# Patient Record
Sex: Male | Born: 2000 | Race: Black or African American | Hispanic: No | Marital: Single
Health system: Southern US, Community
[De-identification: ages and names within clinical notes are randomized; demographics above are authoritative.]

## PROBLEM LIST (undated history)

## (undated) DIAGNOSIS — T7840XA Allergy, unspecified, initial encounter: Secondary | ICD-10-CM

## (undated) HISTORY — DX: Allergy, unspecified, initial encounter: T78.40XA

---
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
## 2021-10-24 ENCOUNTER — Ambulatory Visit (INDEPENDENT_AMBULATORY_CARE_PROVIDER_SITE_OTHER): Payer: Managed Care, Other (non HMO) | Admitting: Family Medicine

## 2021-10-24 VITALS — BP 118/78 | HR 72 | Temp 98.1°F | Ht 69.0 in | Wt 132.8 lb

## 2021-10-24 DIAGNOSIS — Z91018 Allergy to other foods: Secondary | ICD-10-CM

## 2021-10-24 DIAGNOSIS — R011 Cardiac murmur, unspecified: Secondary | ICD-10-CM

## 2021-10-24 DIAGNOSIS — L720 Epidermal cyst: Secondary | ICD-10-CM

## 2021-10-24 DIAGNOSIS — Z7689 Persons encountering health services in other specified circumstances: Secondary | ICD-10-CM

## 2021-10-24 DIAGNOSIS — K5900 Constipation, unspecified: Secondary | ICD-10-CM

## 2021-10-24 MED ORDER — EPINEPHRINE 0.3 MG/0.3ML IJ SOAJ: 0.3000 mg | INTRAMUSCULAR | 1 refills | Status: DC | PRN

## 2021-10-24 NOTE — Progress Notes (Signed)
Patient presents to clinic today to f/u on ongoing concerns and establish care. Pt is accompanied by his mother.  SUBJECTIVE: PMH: Pt is a 21 yo male with pmh sig for food allergies.  Pt previously seen by pediatrician.  Mom denies h/o issues during pregnancy or birth.  Does state pt had jaundice at birth requiring biliblanket/lights.  Bump on head: -Started around puberty -increasing in size, more noticeable -not painful -nothing ever done for it  Allergies to foods: -pt develops a rash if  skin comes in contact with milk or eggs. -Endorses emesis if drinks milk. -avoids dairy. -inquires about allergy testing -has never had or needed epi pen  Constipation: -ongoing h/o   Past surgical history: None  Social history: Patient is single.  He is a Engineer, agricultural.  Attending [employer].  Looking for a job.  Patient denies alcohol, tobacco, drug use.  Health Maintenance: Dental --Bing Matter, [facility] Vision --Patty vision Immunizations --  Family medical history: Mom-Alive, DM 1 Dad-alive, renal disease Brother-Andrew, alive Brother-Greg, deceased, early death MGM-deceased at age 15, ovarian cancer MGF-deceased, DM 2, MI x2, schizophrenia PGM-alive  No past medical history on file.    No current outpatient medications on file prior to visit.   No current facility-administered medications on file prior to visit.    Not on File  No family history on file.  Social History   Socioeconomic History   Marital status: Single    Spouse name: Not on file   Number of children: Not on file   Years of education: Not on file   Highest education level: Not on file  Occupational History   Not on file  Tobacco Use   Smoking status: Not on file   Smokeless tobacco: Not on file  Substance and Sexual Activity   Alcohol use: Not on file   Drug use: Not on file   Sexual activity: Not on file  Other Topics Concern   Not on file  Social History Narrative    Not on file   Social Determinants of Health   Financial Resource Strain: Not on file  Food Insecurity: Not on file  Transportation Needs: Not on file  Physical Activity: Not on file  Stress: Not on file  Social Connections: Not on file  Intimate Partner Violence: Not on file    ROS General: Denies fever, chills, night sweats, changes in weight, changes in appetite HEENT: Denies headaches, ear pain, changes in vision, rhinorrhea, sore throat CV: Denies CP, palpitations, SOB, orthopnea Pulm: Denies SOB, cough, wheezing GI: Denies abdominal pain, nausea, vomiting, diarrhea, constipation GU: Denies dysuria, hematuria, frequency, vaginal discharge Msk: Denies muscle cramps, joint pains Neuro: Denies weakness, numbness, tingling Skin: Denies rashes, bruising  +bump of scalp Psych: Denies depression, anxiety, hallucinations   BP 118/78 (BP Location: Right Arm, Patient Position: Sitting, Cuff Size: Normal)   Pulse 72   Temp 98.1 F (36.7 C) (Oral)   Ht 5\' 9"  (1.753 m)   Wt 132 lb 12.8 oz (60.2 kg)   SpO2 96%   BMI 19.61 kg/m   Physical Exam Gen. Pleasant, well developed, well-nourished, in NAD HEENT - [place]/AT, PERRL, EOMI, conjunctive clear, no scleral icterus, no nasal drainage, TMs normal b/l. Lungs: no use of accessory muscles, CTAB, no wheezes, rales or rhonchi Cardiovascular: RRR, No r/g/m, click heard in Tricuspid area,  no peripheral edema Abdomen: BS present, soft, nontender,nondistended Musculoskeletal: No deformities, moves all four extremities, no cyanosis or clubbing, normal tone  Neuro:  A&Ox3, CN II-XII intact, normal gait Skin:  Warm, dry, intact.  L posterior parietal area with mobile slightly fluctuant mass underneath scalp. No TTP of scalp.  No skin changes.  No results found for this or any previous visit (from the past 2160 hour(s)).  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 -bump on head feels  consistent with cyst, likely epidermoid. -given location discussed referral to derm vs plastics for removal.  - Plan: Ambulatory referral to Dermatology  Systolic click  -new problem.  Never mentioned in past. -click heard on exam in tricuspid area - Plan: ECHOCARDIOGRAM COMPLETE  Multiple food allergies -continue avoiding foods known to cause problems. -Consider allergy testing.  If desired will place referral to allergist. -Given Rx for EpiPen  - Plan: EPINEPHrine 0.3 mg/0.3 mL IJ SOAJ injection  Constipation, unspecified constipation type -Discussed decreasing intake of fried, greasy, and fast foods. -Discussed increasing p.o. intake of water and vegetables -OTC MiraLAX as needed -For continued worsening symptoms will place referral to GI  Encounter to establish care -We reviewed the PMH, PSH, FH, SH, Meds and Allergies. -We provided refills for any medications we will prescribe as needed. -We addressed current concerns per orders and patient instructions. -We have asked for records for pertinent exams, studies, vaccines and notes from previous providers. -We have advised patient to follow up per instructions below.  F/u prn for CPE at patien'st convenience.  Abbe Amsterdam, MD

## 2021-10-24 NOTE — Patient Instructions (Signed)
Referrals were placed for you to see the Dermatologist for the cyst like area on your scalp and one for the ECHO of your heart.  You should expect phone calls about scheduling these appointments.  If you have not heard anything in the next 2 weeks notify the office so that we can check into it.  It was a pleasure meeting you guys this visit take care.

## 2021-10-28 ENCOUNTER — Telehealth: Payer: Self-pay

## 2021-10-28 NOTE — Telephone Encounter (Signed)
PA started for EPINEPHrine 0.3 mg pen. Key [id number]

## 2021-10-29 ENCOUNTER — Encounter: Payer: Self-pay | Admitting: Family Medicine

## 2021-11-06 ENCOUNTER — Ambulatory Visit (HOSPITAL_COMMUNITY): Payer: Managed Care, Other (non HMO) | Attending: Family Medicine

## 2021-11-06 DIAGNOSIS — I5030 Unspecified diastolic (congestive) heart failure: Secondary | ICD-10-CM | POA: Diagnosis not present

## 2021-11-06 DIAGNOSIS — R011 Cardiac murmur, unspecified: Secondary | ICD-10-CM | POA: Diagnosis not present

## 2021-11-06 DIAGNOSIS — I5033 Acute on chronic diastolic (congestive) heart failure: Secondary | ICD-10-CM

## 2021-11-06 LAB — ECHOCARDIOGRAM COMPLETE
Area-P 1/2: 4.23 cm2
S' Lateral: 2.8 cm

## 2021-11-07 ENCOUNTER — Ambulatory Visit (INDEPENDENT_AMBULATORY_CARE_PROVIDER_SITE_OTHER): Payer: Managed Care, Other (non HMO) | Admitting: Family Medicine

## 2021-11-07 ENCOUNTER — Other Ambulatory Visit (INDEPENDENT_AMBULATORY_CARE_PROVIDER_SITE_OTHER): Payer: Managed Care, Other (non HMO)

## 2021-11-07 ENCOUNTER — Encounter: Payer: Managed Care, Other (non HMO) | Admitting: Family Medicine

## 2021-11-07 VITALS — BP 124/68 | HR 68 | Temp 98.2°F | Ht 69.0 in | Wt 132.0 lb

## 2021-11-07 DIAGNOSIS — Z0000 Encounter for general adult medical examination without abnormal findings: Secondary | ICD-10-CM

## 2021-11-07 DIAGNOSIS — Z91018 Allergy to other foods: Secondary | ICD-10-CM | POA: Diagnosis not present

## 2021-11-07 DIAGNOSIS — I5189 Other ill-defined heart diseases: Secondary | ICD-10-CM

## 2021-11-07 LAB — CBC WITH DIFFERENTIAL/PLATELET
Basophils Absolute: 0 10*3/uL (ref 0.0–0.1)
Basophils Relative: 0.8 % (ref 0.0–3.0)
Eosinophils Absolute: 0 10*3/uL (ref 0.0–0.7)
Eosinophils Relative: 0.8 % (ref 0.0–5.0)
HCT: 43.3 % (ref 39.0–52.0)
Hemoglobin: 14.2 g/dL (ref 13.0–17.0)
Lymphocytes Relative: 38.5 % (ref 12.0–46.0)
Lymphs Abs: 1.7 10*3/uL (ref 0.7–4.0)
MCHC: 32.7 g/dL (ref 30.0–36.0)
MCV: 88.4 fl (ref 78.0–100.0)
Monocytes Absolute: 0.4 10*3/uL (ref 0.1–1.0)
Monocytes Relative: 8.4 % (ref 3.0–12.0)
Neutro Abs: 2.2 10*3/uL (ref 1.4–7.7)
Neutrophils Relative %: 51.5 % (ref 43.0–77.0)
Platelets: 191 10*3/uL (ref 150.0–400.0)
RBC: 4.9 Mil/uL (ref 4.22–5.81)
RDW: 12.3 % (ref 11.5–15.5)
WBC: 4.3 10*3/uL (ref 4.0–10.5)

## 2021-11-07 LAB — LIPID PANEL
Cholesterol: 151 mg/dL (ref 0–200)
HDL: 45.3 mg/dL (ref >39.00)
LDL Cholesterol: 90 mg/dL (ref 0–99)
NonHDL: 105.95
Total CHOL/HDL Ratio: 3
Triglycerides: 82 mg/dL (ref 0.0–149.0)
VLDL: 16.4 mg/dL (ref 0.0–40.0)

## 2021-11-07 LAB — COMPREHENSIVE METABOLIC PANEL
ALT: 57 U/L — ABNORMAL HIGH (ref 0–53)
AST: 31 U/L (ref 0–37)
Albumin: 4.3 g/dL (ref 3.5–5.2)
Alkaline Phosphatase: 92 U/L (ref 39–117)
BUN: 17 mg/dL (ref 6–23)
CO2: 30 mEq/L (ref 19–32)
Calcium: 9.8 mg/dL (ref 8.4–10.5)
Chloride: 101 mEq/L (ref 96–112)
Creatinine, Ser: 0.94 mg/dL (ref 0.40–1.50)
GFR: 115.92 mL/min (ref >60.00)
Glucose, Bld: 81 mg/dL (ref 70–99)
Potassium: 4.3 mEq/L (ref 3.5–5.1)
Sodium: 136 mEq/L (ref 135–145)
Total Bilirubin: 0.6 mg/dL (ref 0.2–1.2)
Total Protein: 7.1 g/dL (ref 6.0–8.3)

## 2021-11-07 MED ORDER — EPINEPHRINE 0.3 MG/0.3ML IJ SOAJ: 0.3000 mg | INTRAMUSCULAR | 1 refills | Status: AC | PRN

## 2021-11-07 NOTE — Patient Instructions (Addendum)
It appears that the referral for dermatology was placed by your insurance company and sent to [facility].  If you have not heard anything from them by the end of the week consider calling their office to inquire about an appointment.  The prescription for the EpiPen was printed just in case your pharmacy tries to give you a hard time about it again. You can take it to a different pharmacy if needed.

## 2021-11-07 NOTE — Progress Notes (Signed)
Subjective:     Walter Palmer is a 21 y.o. male and is here for a comprehensive physical exam. The patient reports doing well.  Was not able to obtain EPI pen from pharmacy, was told they didn't have it.?  Pt had ECHO yesterday for click heard on exam, inquires about results.  Pt also inquires about Derm referral placed for cyst in scalp.    Social History   Socioeconomic History   Marital status: Single    Spouse name: Not on file   Number of children: Not on file   Years of education: Not on file   Highest education level: Not on file  Occupational History   Not on file  Tobacco Use   Smoking status: Not on file   Smokeless tobacco: Not on file  Substance and Sexual Activity   Alcohol use: Not on file   Drug use: Not on file   Sexual activity: Not on file  Other Topics Concern   Not on file  Social History Narrative   Not on file   Social Determinants of Health   Financial Resource Strain: Not on file  Food Insecurity: Not on file  Transportation Needs: Not on file  Physical Activity: Not on file  Stress: Not on file  Social Connections: Not on file  Intimate Partner Violence: Not on file   Health Maintenance  Topic Date Due   HPV VACCINES (1 - Male 2-dose series) Never done   HIV Screening  Never done   Hepatitis C Screening  Never done   TETANUS/TDAP  Never done   INFLUENZA VACCINE  05/18/2022 (Originally 09/17/2021)    The following portions of the patient's history were reviewed and updated as appropriate: allergies, current medications, past family history, past medical history, past social history, past surgical history, and problem list.  Review of Systems Pertinent items noted in HPI and remainder of comprehensive ROS otherwise negative.   Objective:    BP 124/68 (BP Location: Right Arm, Patient Position: Sitting, Cuff Size: Normal)   Pulse 68   Temp 98.2 F (36.8 C) (Oral)   Ht 5\' 9"  (1.753 m)   Wt 132 lb (59.9 kg)   SpO2 97%   BMI 19.49 kg/m   General appearance: alert, cooperative, and no distress Head: Normocephalic, without obvious abnormality, atraumatic Eyes: conjunctivae/corneas clear. PERRL, EOM's intact. Fundi benign. Ears: normal TM's and external ear canals both ears Nose: Nares normal. Septum midline. Mucosa normal. No drainage or sinus tenderness. Throat: lips, mucosa, and tongue normal; teeth and gums normal Neck: no adenopathy, no carotid bruit, no JVD, supple, symmetrical, trachea midline, and thyroid not enlarged, symmetric, no tenderness/mass/nodules Lungs: clear to auscultation bilaterally Heart:  RRR, click heard in tricuspid area> other areas, no murmur or rub, no peripheral edema Abdomen: soft, non-tender; bowel sounds normal; no masses,  no organomegaly Extremities: extremities normal, atraumatic, no cyanosis or edema Pulses: 2+ and symmetric Skin: Skin color, texture, turgor normal. No rashes or lesions Lymph nodes: Cervical, supraclavicular, and axillary nodes normal. Neurologic: Alert and oriented X 3, normal strength and tone. Normal symmetric reflexes. Normal coordination and gait    Assessment:    Healthy male exam.      Plan:    Anticipatory guidance given including wearing seatbelts, smoke detectors in the home, increasing physical activity, increasing p.o. intake of water and vegetables. -labs -immunizations reviewed.  Pt declines influenza vaccine at this time. -given handout -next CPE in 1 yr See After Visit Summary for Counseling  Recommendations  Well adult exam - Plan: CBC with Differential/Platelet, TSH, T4, Free, Hemoglobin A1c, Lipid panel, CMP, Vitamin D, 25-hydroxy, Vitamin B12  Multiple food allergies  - Plan: EPINEPHrine 0.3 mg/0.3 mL IJ SOAJ injection  Grade II diastolic dysfunction -Echo obtained after hearing a click in tricuspid area during OFV 10/24/2021. -ECHO 11/06/21 with LVEF 55-60%.  LV normal function.  LV has no regional wall motion abnormalities.  No LV hypertrophy.   LV diastolic parameters are consistent with grade 2 diastolic dysfunction (pseudonormalization). -Will place referral to cardiology  F/u prn  Abbe Amsterdam, MD

## 2021-11-08 LAB — TSH: TSH: 1.86 u[IU]/mL (ref 0.35–5.50)

## 2021-11-08 LAB — VITAMIN B12: Vitamin B-12: 998 pg/mL — ABNORMAL HIGH (ref 211–911)

## 2021-11-08 LAB — T4, FREE: Free T4: 0.74 ng/dL (ref 0.60–1.60)

## 2021-11-08 LAB — VITAMIN D 25 HYDROXY (VIT D DEFICIENCY, FRACTURES): VITD: 93.4 ng/mL (ref 30.00–100.00)

## 2021-11-11 LAB — HEMOGLOBIN A1C: Hgb A1c MFr Bld: 5.4 % (ref 4.6–6.5)

## 2022-10-02 ENCOUNTER — Encounter (INDEPENDENT_AMBULATORY_CARE_PROVIDER_SITE_OTHER): Payer: Self-pay

## 2022-11-10 ENCOUNTER — Encounter: Payer: Managed Care, Other (non HMO) | Admitting: Family Medicine

## 2022-11-17 ENCOUNTER — Encounter: Payer: Self-pay | Admitting: Family Medicine

## 2022-11-17 ENCOUNTER — Ambulatory Visit (INDEPENDENT_AMBULATORY_CARE_PROVIDER_SITE_OTHER): Payer: 59 | Admitting: Family Medicine

## 2022-11-17 VITALS — BP 120/80 | HR 81 | Temp 98.1°F | Ht 68.9 in | Wt 133.8 lb

## 2022-11-17 DIAGNOSIS — Z0000 Encounter for general adult medical examination without abnormal findings: Secondary | ICD-10-CM

## 2022-11-17 DIAGNOSIS — I5189 Other ill-defined heart diseases: Secondary | ICD-10-CM

## 2022-11-17 LAB — CBC WITH DIFFERENTIAL/PLATELET
Basophils Absolute: 0 10*3/uL (ref 0.0–0.1)
Basophils Relative: 1 % (ref 0.0–3.0)
Eosinophils Absolute: 0 10*3/uL (ref 0.0–0.7)
Eosinophils Relative: 0.8 % (ref 0.0–5.0)
HCT: 47.9 % (ref 39.0–52.0)
Hemoglobin: 15.1 g/dL (ref 13.0–17.0)
Lymphocytes Relative: 39.4 % (ref 12.0–46.0)
Lymphs Abs: 1.5 10*3/uL (ref 0.7–4.0)
MCHC: 31.5 g/dL (ref 30.0–36.0)
MCV: 89.8 fL (ref 78.0–100.0)
Monocytes Absolute: 0.4 10*3/uL (ref 0.1–1.0)
Monocytes Relative: 9.2 % (ref 3.0–12.0)
Neutro Abs: 1.9 10*3/uL (ref 1.4–7.7)
Neutrophils Relative %: 49.6 % (ref 43.0–77.0)
Platelets: 203 10*3/uL (ref 150.0–400.0)
RBC: 5.34 Mil/uL (ref 4.22–5.81)
RDW: 12.7 % (ref 11.5–15.5)
WBC: 3.9 10*3/uL — ABNORMAL LOW (ref 4.0–10.5)

## 2022-11-17 LAB — COMPREHENSIVE METABOLIC PANEL
ALT: 59 U/L — ABNORMAL HIGH (ref 0–53)
AST: 38 U/L — ABNORMAL HIGH (ref 0–37)
Albumin: 4.6 g/dL (ref 3.5–5.2)
Alkaline Phosphatase: 78 U/L (ref 39–117)
BUN: 15 mg/dL (ref 6–23)
CO2: 31 meq/L (ref 19–32)
Calcium: 9.9 mg/dL (ref 8.4–10.5)
Chloride: 99 meq/L (ref 96–112)
Creatinine, Ser: 0.94 mg/dL (ref 0.40–1.50)
GFR: 115.09 mL/min (ref >60.00)
Glucose, Bld: 81 mg/dL (ref 70–99)
Potassium: 4.1 meq/L (ref 3.5–5.1)
Sodium: 138 meq/L (ref 135–145)
Total Bilirubin: 0.6 mg/dL (ref 0.2–1.2)
Total Protein: 7.3 g/dL (ref 6.0–8.3)

## 2022-11-17 LAB — LIPID PANEL
Cholesterol: 178 mg/dL (ref 0–200)
HDL: 55.1 mg/dL (ref >39.00)
LDL Cholesterol: 106 mg/dL — ABNORMAL HIGH (ref 0–99)
NonHDL: 123.37
Total CHOL/HDL Ratio: 3
Triglycerides: 86 mg/dL (ref 0.0–149.0)
VLDL: 17.2 mg/dL (ref 0.0–40.0)

## 2022-11-17 LAB — T4, FREE: Free T4: 0.72 ng/dL (ref 0.60–1.60)

## 2022-11-17 LAB — TSH: TSH: 2.76 u[IU]/mL (ref 0.35–5.50)

## 2022-11-17 LAB — HEMOGLOBIN A1C: Hgb A1c MFr Bld: 5.2 % (ref 4.6–6.5)

## 2022-11-17 NOTE — Progress Notes (Signed)
Established Patient Office Visit   Subjective  Patient ID: Walter Palmer, male    DOB: [birth year]  Age: 22 y.o. MRN: [id number]  Chief Complaint  Patient presents with   Annual Exam    Patient is a 22 year old male seen for CPE.  Patient states he has been doing well overall and is without complaint.  At last OFV patient noted to have grade 2 diastolic dysfunction based on echo.  Referral to cardiology placed however patient does not recall having an appointment.  Patient mentions hair loss and scalp.  States his dad lost his hair early.  Patient was finally able to get EpiPen.    There are no problems to display for this patient.  History reviewed. No pertinent past medical history. History reviewed. No pertinent surgical history.   History reviewed. No pertinent family history. Allergies  Allergen Reactions   Milk-Related Compounds     Rash with contact.  Emesis with ingestion.   Egg-Derived Products Nausea And Vomiting and Rash    Rash   Milk (Cow) Nausea And Vomiting and Rash      ROS Negative unless stated above    Objective:     BP 120/80 (BP Location: Right Arm, Patient Position: Sitting, Cuff Size: Normal)   Pulse 81   Temp 98.1 F (36.7 C) (Oral)   Ht 5' 8.9" (1.75 m)   Wt 133 lb 12.8 oz (60.7 kg)   SpO2 95%   BMI 19.82 kg/m  BP Readings from Last 3 Encounters:  11/17/22 120/80  11/07/21 124/68  10/24/21 118/78   Wt Readings from Last 3 Encounters:  11/17/22 133 lb 12.8 oz (60.7 kg)  11/07/21 132 lb (59.9 kg)  10/24/21 132 lb 12.8 oz (60.2 kg)      Physical Exam Constitutional:      Appearance: Normal appearance.  HENT:     Head: Normocephalic and atraumatic.     Right Ear: Tympanic membrane, ear canal and external ear normal.     Left Ear: Tympanic membrane, ear canal and external ear normal.     Nose: Nose normal.     Mouth/Throat:     Mouth: Mucous membranes are moist.     Pharynx: No oropharyngeal exudate or posterior oropharyngeal  erythema.  Eyes:     General: No scleral icterus.    Extraocular Movements: Extraocular movements intact.     Conjunctiva/sclera: Conjunctivae normal.     Pupils: Pupils are equal, round, and reactive to light.  Neck:     Thyroid: No thyromegaly.  Cardiovascular:     Rate and Rhythm: Normal rate and regular rhythm.     Pulses: Normal pulses.     Heart sounds: Normal heart sounds. No murmur heard.    No friction rub.  Pulmonary:     Effort: Pulmonary effort is normal.     Breath sounds: Normal breath sounds. No wheezing, rhonchi or rales.  Abdominal:     General: Bowel sounds are normal.     Palpations: Abdomen is soft.     Tenderness: There is no abdominal tenderness.  Musculoskeletal:        General: No deformity. Normal range of motion.  Lymphadenopathy:     Cervical: No cervical adenopathy.  Skin:    General: Skin is warm and dry.     Findings: No lesion.  Neurological:     General: No focal deficit present.     Mental Status: He is alert and oriented to person, place, and time.  Psychiatric:        Mood and Affect: Mood normal.        Thought Content: Thought content normal.    No results found for any visits on 11/17/22.    Assessment & Plan:  Well adult exam -Age-appropriate health screenings discussed -Will obtain labs -Immunizations reviewed.  Patient Clines influenza vaccine this visit -Next CPE in 1 year -     CBC with Differential/Platelet -     TSH -     T4, free -     Hemoglobin A1c -     Lipid panel -     Comprehensive metabolic panel  Grade II diastolic dysfunction -Stable, euvolemic. -Echo from 920/23 with EF 55-60%, grade 2 diastolic dysfunction (pseudonormalization) -     Ambulatory referral to Cardiology -     Comprehensive metabolic panel  Return in about 1 year (around 11/17/2023) for physical.   Deeann Saint, MD

## 2022-11-18 ENCOUNTER — Encounter: Payer: Self-pay | Admitting: Family Medicine

## 2022-11-18 DIAGNOSIS — I5189 Other ill-defined heart diseases: Secondary | ICD-10-CM | POA: Insufficient documentation

## 2022-11-24 ENCOUNTER — Telehealth: Payer: Self-pay | Admitting: Family Medicine

## 2022-11-24 NOTE — Telephone Encounter (Signed)
Pt is calling and would like to know why was he referred to cardiologist

## 2022-11-26 NOTE — Telephone Encounter (Signed)
Called and spoke with patient I also sent the patient a message on my chart.

## 2022-11-26 NOTE — Telephone Encounter (Signed)
The echo had grade 2 diastolic dysfunction (pseudonormalization) with normal ejection fraction 60-65%.  Structurally the chambers of the heart are fine.  Grade 2 diastolic dysfunction refers to slight increase in pressure in the heart.  Right now it is not causing any issues but should be monitored.  A referral to cardiology was actually placed last year shortly after the echo was done, but it appears per chart review, that the appointment was declined when cardiology called to set it up.

## 2022-12-19 ENCOUNTER — Encounter (HOSPITAL_BASED_OUTPATIENT_CLINIC_OR_DEPARTMENT_OTHER): Payer: Self-pay

## 2023-11-18 ENCOUNTER — Ambulatory Visit: Payer: 59 | Admitting: Family Medicine

## 2023-11-18 VITALS — BP 130/76 | HR 94 | Temp 98.6°F | Ht 68.9 in | Wt 143.4 lb

## 2023-11-18 DIAGNOSIS — I5189 Other ill-defined heart diseases: Secondary | ICD-10-CM

## 2023-11-18 DIAGNOSIS — Z0000 Encounter for general adult medical examination without abnormal findings: Secondary | ICD-10-CM | POA: Diagnosis not present

## 2023-11-18 NOTE — Progress Notes (Signed)
 Established Patient Office Visit   Subjective  Patient ID: Walter Palmer, male    DOB: [birth year]  Age: 23 y.o. MRN: [id number]  Chief Complaint  Patient presents with   Annual Exam    Patient is a 23 year old male seen for CPE.  Patient states he is doing well overall.  Inquires about having iron checked but wonders if there is an additional cost.    Patient Active Problem List   Diagnosis Date Noted   Grade II diastolic dysfunction 11/18/2022   Past Medical History:  Diagnosis Date   Allergy 2006   Allergies for eggs and milk   History reviewed. No pertinent surgical history. Social History   Tobacco Use   Smoking status: Never   Tobacco comments:    None  Substance Use Topics   Alcohol use: Never   Drug use: Never   Family History  Problem Relation Age of Onset   Diabetes Mother    Vision loss Mother    Kidney disease Father    Heart disease Maternal Grandfather    Cancer Maternal Grandmother    Early death Brother    Early death Maternal Uncle    Allergies  Allergen Reactions   Milk-Related Compounds     Rash with contact.  Emesis with ingestion.   Egg-Derived Products Nausea And Vomiting and Rash    Rash   Milk (Cow) Nausea And Vomiting and Rash    ROS Negative unless stated above    Objective:     BP 130/76 (BP Location: Left Arm, Patient Position: Sitting, Cuff Size: Normal)   Pulse 94   Temp 98.6 F (37 C) (Oral)   Ht 5' 8.9 (1.75 m)   Wt 143 lb 6.4 oz (65 kg)   SpO2 98%   BMI 21.24 kg/m  BP Readings from Last 3 Encounters:  11/18/23 130/76  11/17/22 120/80  11/07/21 124/68   Wt Readings from Last 3 Encounters:  11/18/23 143 lb 6.4 oz (65 kg)  11/17/22 133 lb 12.8 oz (60.7 kg)  11/07/21 132 lb (59.9 kg)      Physical Exam Constitutional:      Appearance: Normal appearance.  HENT:     Head: Normocephalic and atraumatic.     Right Ear: Tympanic membrane, ear canal and external ear normal.     Left Ear: Tympanic membrane,  ear canal and external ear normal.     Nose: Nose normal.     Mouth/Throat:     Mouth: Mucous membranes are moist.     Pharynx: No oropharyngeal exudate or posterior oropharyngeal erythema.  Eyes:     General: No scleral icterus.    Extraocular Movements: Extraocular movements intact.     Conjunctiva/sclera: Conjunctivae normal.     Pupils: Pupils are equal, round, and reactive to light.  Neck:     Thyroid: No thyromegaly.     Vascular: No carotid bruit.  Cardiovascular:     Rate and Rhythm: Normal rate and regular rhythm.     Pulses: Normal pulses.     Heart sounds: Normal heart sounds. No murmur heard.    No friction rub.  Pulmonary:     Effort: Pulmonary effort is normal.     Breath sounds: Normal breath sounds. No wheezing, rhonchi or rales.  Abdominal:     General: Bowel sounds are normal.     Palpations: Abdomen is soft.     Tenderness: There is no abdominal tenderness.  Musculoskeletal:  General: No deformity. Normal range of motion.  Lymphadenopathy:     Cervical: No cervical adenopathy.  Skin:    General: Skin is warm and dry.     Findings: No lesion.  Neurological:     General: No focal deficit present.     Mental Status: He is alert and oriented to person, place, and time.  Psychiatric:        Mood and Affect: Mood normal.        Thought Content: Thought content normal.        11/17/2022    1:26 PM 11/07/2021   11:34 AM 10/24/2021   10:37 AM  Depression screen PHQ 2/9  Decreased Interest 1 0 0  Down, Depressed, Hopeless 0 0 0  PHQ - 2 Score 1 0 0  Altered sleeping 1 0   Tired, decreased energy 1 1   Change in appetite 0 0   Feeling bad or failure about yourself  0 0   Trouble concentrating 0 0   Moving slowly or fidgety/restless 0 0   Suicidal thoughts 0 0   PHQ-9 Score 3 1   Difficult doing work/chores Somewhat difficult Not difficult at all       11/17/2022    1:26 PM  GAD 7 : Generalized Anxiety Score  Nervous, Anxious, on Edge 0   Control/stop worrying 0  Worry too much - different things 1  Trouble relaxing 0  Restless 0  Easily annoyed or irritable 1  Afraid - awful might happen 0  Total GAD 7 Score 2  Anxiety Difficulty Not difficult at all     No results found for any visits on 11/18/23.    Assessment & Plan:   Well adult exam -     Comprehensive metabolic panel with GFR; Future -     CBC with Differential/Platelet; Future -     TSH; Future -     T4, free; Future -     Hemoglobin A1c; Future  Grade II diastolic dysfunction  Age-appropriate health screenings discussed.  Obtain labs.  Immunizations reviewed.  Patient declines Tdap and influenza at this time.  Regular exercise and diet with fruits and vegetables encouraged.  Grade 2 diastolic dysfunction previously noted on echo from 2023.  Stable.  Euvolemic.  No follow-ups on file.   Clotilda JONELLE Single, MD

## 2023-11-19 LAB — COMPREHENSIVE METABOLIC PANEL WITH GFR
ALT: 87 U/L — ABNORMAL HIGH (ref 0–53)
AST: 54 U/L — ABNORMAL HIGH (ref 0–37)
Albumin: 4.6 g/dL (ref 3.5–5.2)
Alkaline Phosphatase: 75 U/L (ref 39–117)
BUN: 17 mg/dL (ref 6–23)
CO2: 28 meq/L (ref 19–32)
Calcium: 9.8 mg/dL (ref 8.4–10.5)
Chloride: 101 meq/L (ref 96–112)
Creatinine, Ser: 1.09 mg/dL (ref 0.40–1.50)
GFR: 95.68 mL/min (ref >60.00)
Glucose, Bld: 97 mg/dL (ref 70–99)
Potassium: 4.4 meq/L (ref 3.5–5.1)
Sodium: 138 meq/L (ref 135–145)
Total Bilirubin: 0.6 mg/dL (ref 0.2–1.2)
Total Protein: 7.3 g/dL (ref 6.0–8.3)

## 2023-11-19 LAB — CBC WITH DIFFERENTIAL/PLATELET
Basophils Absolute: 0.1 K/uL (ref 0.0–0.1)
Basophils Relative: 1.2 % (ref 0.0–3.0)
Eosinophils Absolute: 0 K/uL (ref 0.0–0.7)
Eosinophils Relative: 1.1 % (ref 0.0–5.0)
HCT: 46 % (ref 39.0–52.0)
Hemoglobin: 15 g/dL (ref 13.0–17.0)
Lymphocytes Relative: 38.2 % (ref 12.0–46.0)
Lymphs Abs: 1.6 K/uL (ref 0.7–4.0)
MCHC: 32.5 g/dL (ref 30.0–36.0)
MCV: 88.4 fl (ref 78.0–100.0)
Monocytes Absolute: 0.4 K/uL (ref 0.1–1.0)
Monocytes Relative: 10 % (ref 3.0–12.0)
Neutro Abs: 2.1 K/uL (ref 1.4–7.7)
Neutrophils Relative %: 49.5 % (ref 43.0–77.0)
Platelets: 210 K/uL (ref 150.0–400.0)
RBC: 5.21 Mil/uL (ref 4.22–5.81)
RDW: 12.5 % (ref 11.5–15.5)
WBC: 4.2 K/uL (ref 4.0–10.5)

## 2023-11-19 LAB — T4, FREE: Free T4: 0.83 ng/dL (ref 0.60–1.60)

## 2023-11-19 LAB — HEMOGLOBIN A1C: Hgb A1c MFr Bld: 5.3 % (ref 4.6–6.5)

## 2023-11-19 LAB — TSH: TSH: 2.15 u[IU]/mL (ref 0.35–5.50)

## 2023-11-25 ENCOUNTER — Ambulatory Visit: Payer: Self-pay | Admitting: Family Medicine

## 2024-11-18 ENCOUNTER — Encounter: Admitting: Family Medicine
